# Patient Record
Sex: Male | Born: 1980 | Race: White | Hispanic: No | Marital: Single | Smoking: Never smoker
Health system: Southern US, Community
[De-identification: ages and names within clinical notes are randomized; demographics above are authoritative.]

---

## 2011-06-30 ENCOUNTER — Emergency Department: Payer: Self-pay | Admitting: Emergency Medicine

## 2012-10-04 IMAGING — CT CT HEAD WITHOUT CONTRAST
2 series · 16 of 30 positions shown, 20 images · non-contrast
Comparison: none

REASON FOR EXAM: worsening sever headache
COMMENTS:

[Series 2: without · axial · non-contrast · 0.45mm/px · z∈[+330,+454]mm · 13 of 31 slices shown, 17 images]
[im 3/31  brain]
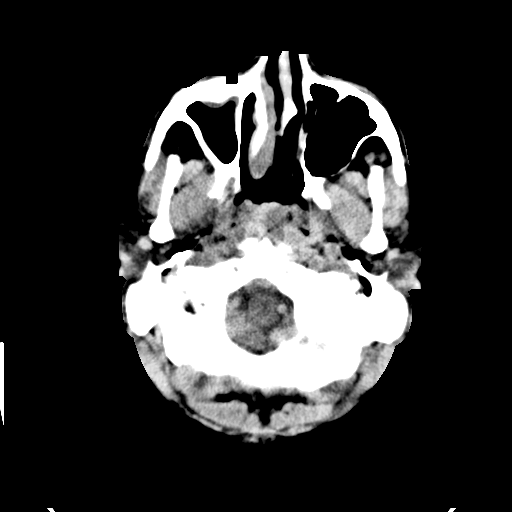
[im 3/31  bone]
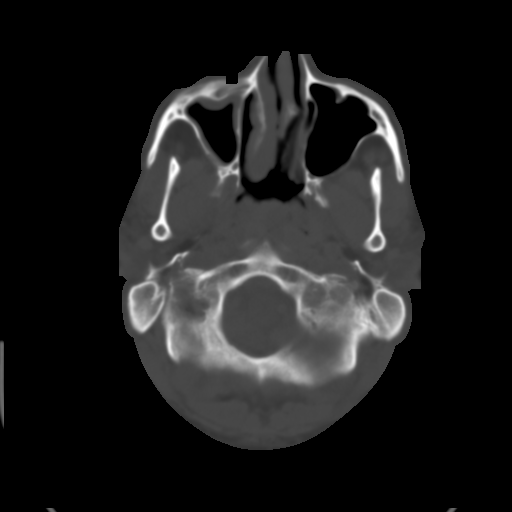
[im 5/31  brain]
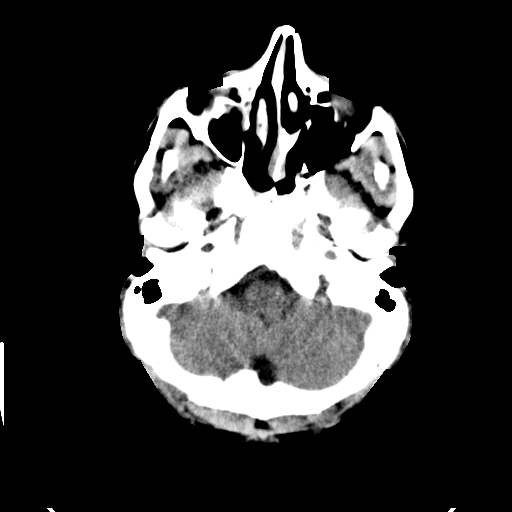
[im 7/31  brain]
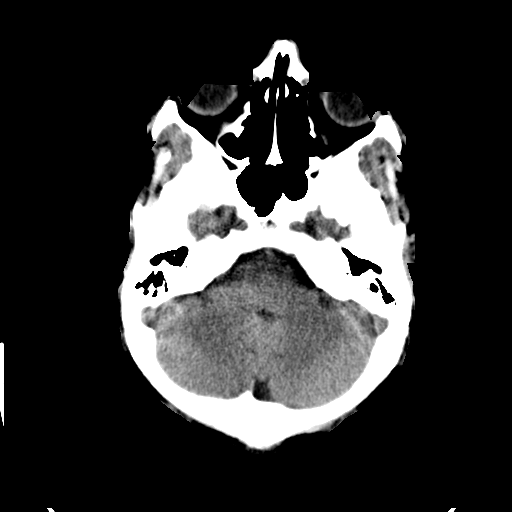
[im 9/31  brain]
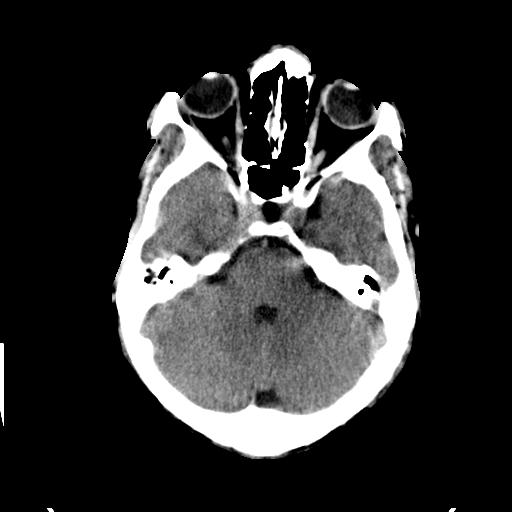
[im 11/31  brain]
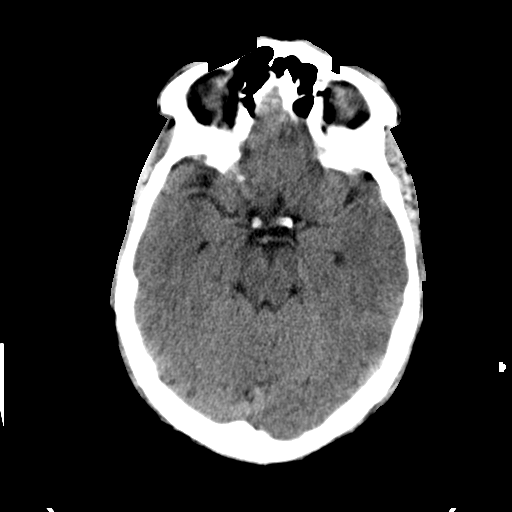
[im 11/31  bone]
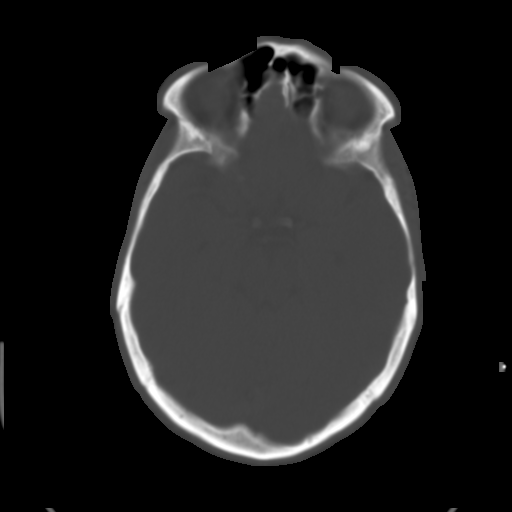
[im 13/31  brain]
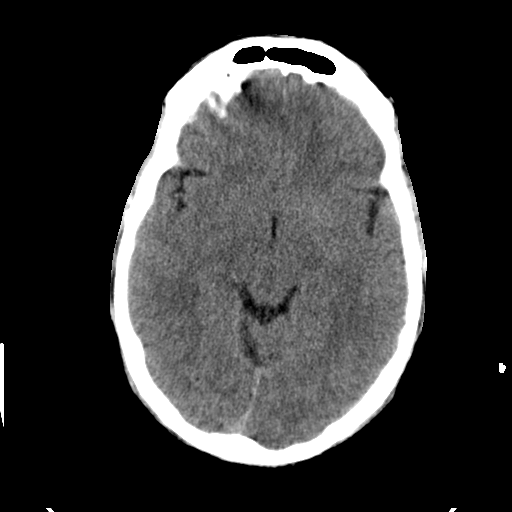
[im 16/31  brain]
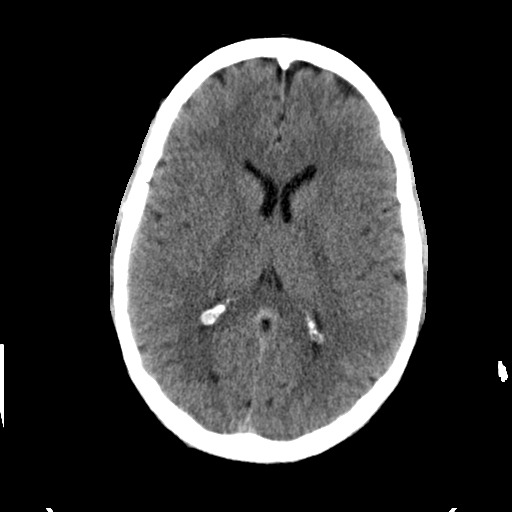
[im 18/31  brain]
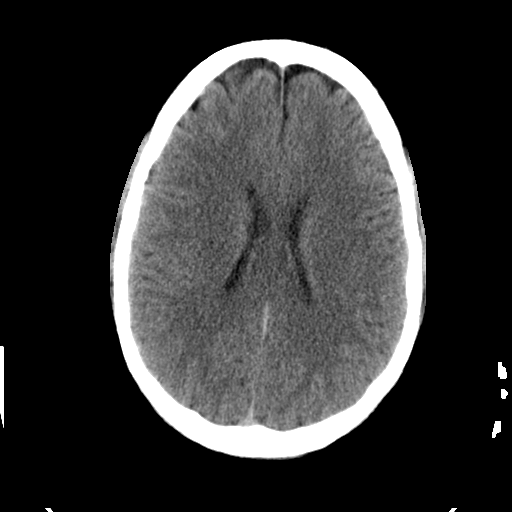
[im 20/31  brain]
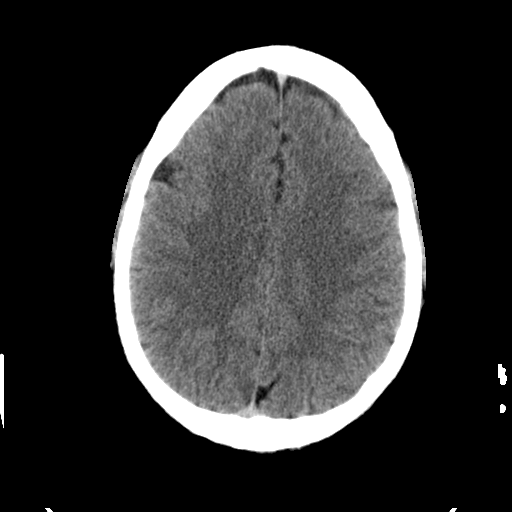
[im 20/31  bone]
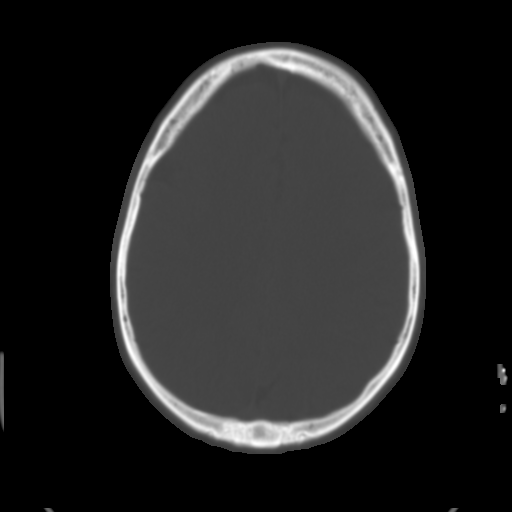
[im 22/31  brain]
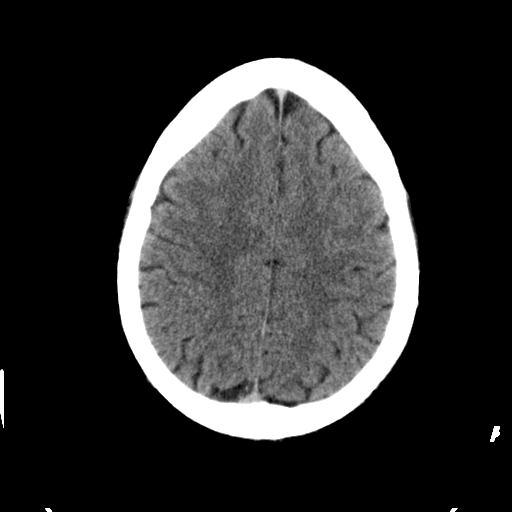
[im 24/31  brain]
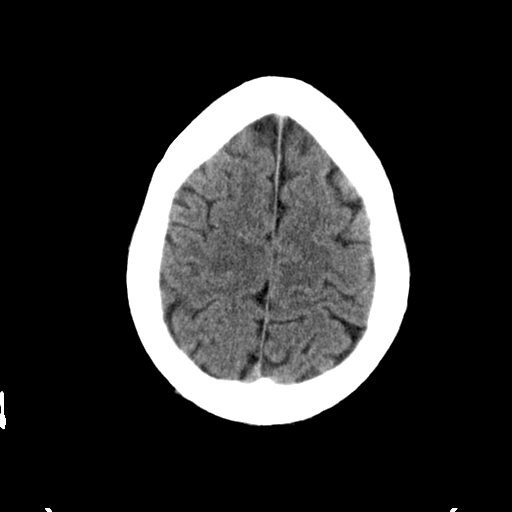
[im 26/31  brain]
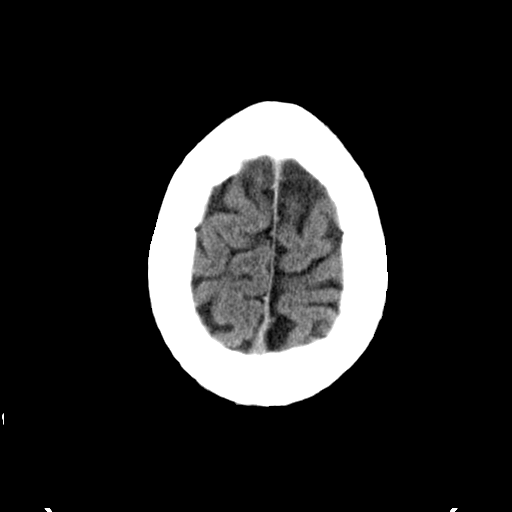
[im 28/31  brain]
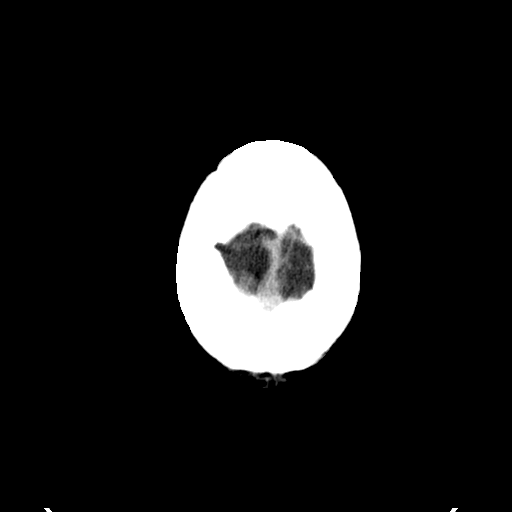
[im 28/31  bone]
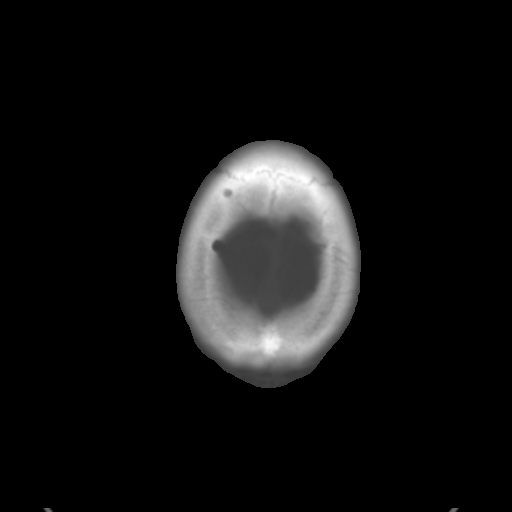

[Series 3: bone · axial · 0.45mm/px · z∈[+330,+370]mm · 3 of 31 slices shown]
[im 3/31  bone]
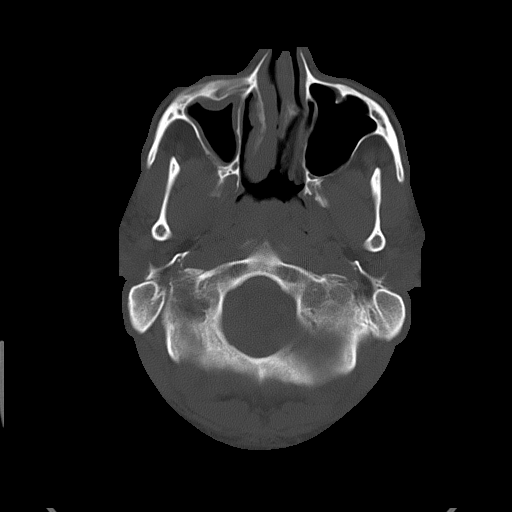
[im 7/31  bone]
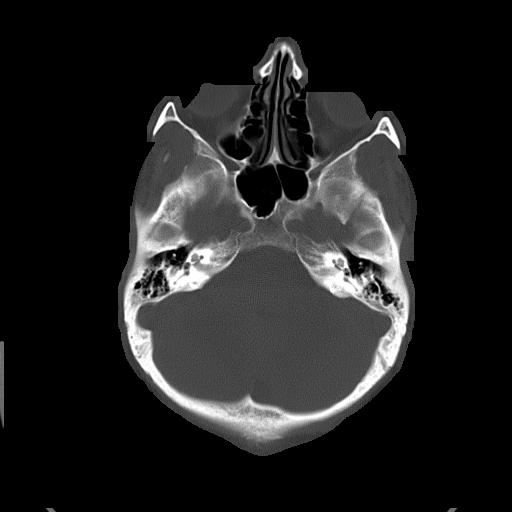
[im 11/31  bone]
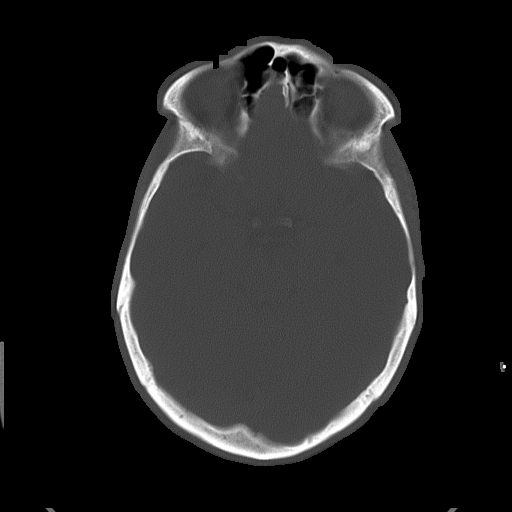

[16 of 30 positions shown; findings below may reference images not displayed]

PROCEDURE:     CT  - CT HEAD WITHOUT CONTRAST  - July 01, 2011  [DATE]

RESULT:     Axial noncontrast CT scanning was performed through the brain
with reconstructions at 5 mm intervals and slice thicknesses.

The ventricles are normal in size and position. There is no intracranial
hemorrhage nor intracranial mass effect. The cerebellum and brainstem are
normal in density. At bone window settings the observed portions of the
paranasal sinuses and mastoid air cells exhibit no air-fluid levels. There
is mucoperiosteal thickening within the right maxillary sinus. There is no
evidence of an acute skull fracture.
IMPRESSION: I see no acute abnormality of the brain. There is mild
inflammatory change of the right maxillary sinus.

A preliminary report was sent to the [HOSPITAL] the conclusion
of the study.

## 2020-02-23 ENCOUNTER — Emergency Department
Admission: EM | Admit: 2020-02-23 | Discharge: 2020-02-23 | Disposition: A | Discharge status: Home / Self Care | Payer: Self-pay | Attending: Student in an Organized Health Care Education/Training Program | Admitting: Student in an Organized Health Care Education/Training Program

## 2020-02-23 ENCOUNTER — Encounter: Payer: Self-pay | Admitting: Emergency Medicine

## 2020-02-23 ENCOUNTER — Emergency Department: Payer: Self-pay

## 2020-02-23 ENCOUNTER — Other Ambulatory Visit: Payer: Self-pay

## 2020-02-23 DIAGNOSIS — L03011 Cellulitis of right finger: Secondary | ICD-10-CM | POA: Insufficient documentation

## 2020-02-23 MED ORDER — NAPROXEN 500 MG PO TABS
500.0000 mg | ORAL_TABLET | Freq: Two times a day (BID) | ORAL | 0 refills | Status: AC
Start: 2020-02-23 — End: ?

## 2020-02-23 MED ORDER — TRAMADOL HCL 50 MG PO TABS: 50.0000 mg | ORAL_TABLET | Freq: Four times a day (QID) | ORAL | 0 refills | Status: AC | PRN

## 2020-02-23 MED ORDER — LIDOCAINE-EPINEPHRINE-TETRACAINE (LET) TOPICAL GEL
3.0000 mL | Freq: Once | TOPICAL | Status: AC
  Administered 2020-02-23: 3 mL via TOPICAL
  Filled 2020-02-23: qty 3

## 2020-02-23 MED ORDER — SULFAMETHOXAZOLE-TRIMETHOPRIM 800-160 MG PO TABS: 1.0000 | ORAL_TABLET | Freq: Two times a day (BID) | ORAL | 0 refills | Status: AC

## 2020-02-23 NOTE — ED Triage Notes (Signed)
Pt presents to ED via POV with c/o L thumb pain/swelling. Pt with noted swelling and bruising to L thumb, pt with bruising around the thumb nail at this time. Pt denies known injury to thumb at this time.

## 2020-02-23 NOTE — ED Notes (Signed)
See triage note  Presents with pain and swelling to left thumb  States he noted some pain to lateral aspect of nail bed yesterday  Woke up with swelling bruising under nail bed and redness

## 2020-02-23 NOTE — Discharge Instructions (Signed)
Follow discharge care instructions and take medication as directed.  Advised Epson salt soaks twice a day for 3 to 5 days.

## 2020-02-23 NOTE — ED Provider Notes (Signed)
[facility] Emergency Department Provider Note   ____________________________________________   First MD Initiated Contact with Patient 02/23/20 [phone]     (approximate)  I have reviewed the triage vital signs and the nursing notes.   HISTORY  Chief Complaint Hand Pain (L Thumb)    HPI OMARRI EICH is a 39 y.o. male patient presents with pain, edema, and subungual hematoma left thumb.  Patient complaint started 2 days ago.  Patient states cannot remember provoking incident for complaint.  Patient denies loss sensation loss of function.  Patient is right-hand dominant.  Patient rates pain as 8/10.  Patient described the pain is "achy".  No palliative measure for complaint.      History reviewed. No pertinent past medical history.  There are no problems to display for this patient.   History reviewed. No pertinent surgical history.  Prior to Admission medications   Medication Sig Start Date End Date Taking? Authorizing Provider  naproxen (NAPROSYN) 500 MG tablet Take 1 tablet (500 mg total) by mouth 2 (two) times daily with a meal. 02/23/20   Joni Reining, PA-C  sulfamethoxazole-trimethoprim (BACTRIM DS) 800-160 MG tablet Take 1 tablet by mouth 2 (two) times daily. 02/23/20   Joni Reining, PA-C  traMADol (ULTRAM) 50 MG tablet Take 1 tablet (50 mg total) by mouth every 6 (six) hours as needed for moderate pain. 02/23/20   Joni Reining, PA-C    Allergies Patient has no known allergies.  No family history on file.  Social History Social History   Tobacco Use  . Smoking status: Never Smoker  . Smokeless tobacco: Never Used  Substance Use Topics  . Alcohol use: Not Currently  . Drug use: Not Currently    Comment: Hx of drug abuse    Review of Systems Constitutional: No fever/chills Eyes: No visual changes. ENT: No sore throat. Cardiovascular: Denies chest pain. Respiratory: Denies shortness of breath. Gastrointestinal: No abdominal  pain.  No nausea, no vomiting.  No diarrhea.  No constipation. Genitourinary: Negative for dysuria. Musculoskeletal: Edema left distal thumb. Skin: Negative for rash. Neurological: Negative for headaches, focal weakness or numbness.   ____________________________________________   PHYSICAL EXAM:  VITAL SIGNS: ED Triage Vitals  Enc Vitals Group     BP 02/23/20 0722 (!) 142/86     Pulse Rate 02/23/20 0722 75     Resp 02/23/20 0722 20     Temp 02/23/20 0722 98.4 F (36.9 C)     Temp Source 02/23/20 0722 Oral     SpO2 02/23/20 0722 100 %     Weight 02/23/20 0720 (!) 205 lb (93 kg)     Height 02/23/20 0720 5\' 8"  (1.727 m)     Head Circumference --      Peak Flow --      Pain Score 02/23/20 0720 8     Pain Loc --      Pain Edu? --      Excl. in GC? --     Constitutional: Alert and oriented. Well appearing and in no acute distress. Cardiovascular: Normal rate, regular rhythm. Grossly normal heart sounds.  Good peripheral circulation. Respiratory: Normal respiratory effort.  No retractions. Lungs CTAB. Musculoskeletal: No obvious deformity to the left thumb.  Marked edema. Neurologic:  Normal speech and language. No gross focal neurologic deficits are appreciated. No gait instability. Skin:  Skin is warm, dry and intact.  2 blisters to dorsal aspect of the left thumb.  Small subungual hematoma.  Psychiatric: Mood and affect are normal. Speech and behavior are normal.  ____________________________________________   LABS (all labs ordered are listed, but only abnormal results are displayed)  Labs Reviewed - No data to display ____________________________________________  EKG   ____________________________________________  RADIOLOGY  ED MD interpretation:    Official radiology report(s): DG Finger Thumb Left  Result Date: 02/23/2020 CLINICAL DATA:  Pain and edema with subungual hematoma EXAM: LEFT THUMB 2+V COMPARISON:  None. FINDINGS: Frontal, oblique, and lateral  views were obtained. There is soft tissue swelling of the first digit with subungual soft tissue fullness. No radiopaque foreign body or soft tissue air. No fracture or dislocation. Joint spaces appear normal. No erosive change or bony destruction. IMPRESSION: No bony abnormality. No arthropathy. Subungual soft tissue swelling as well as somewhat milder swelling throughout the first digit. No soft tissue air or radiopaque foreign body evident. Electronically Signed   By: Bretta Bang III M.D.   On: 02/23/2020 08:00    ____________________________________________   PROCEDURES  Procedure(s) performed (including Critical Care):  Marland KitchenMarland KitchenIncision and Drainage  Date/Time: 02/23/2020 8:56 AM Performed by: Joni Reining, PA-C Authorized by: Joni Reining, PA-C   Consent:    Consent obtained:  Verbal   Consent given by:  Patient   Risks discussed:  Bleeding, incomplete drainage and pain Location:    Type:  Abscess   Location:  Upper extremity   Upper extremity location:  Finger   Finger location:  L thumb Pre-procedure details:    Skin preparation:  Betadine Anesthesia (see MAR for exact dosages):    Anesthesia method:  Topical application   Topical anesthetic:  LET Procedure type:    Complexity:  Simple Procedure details:    Incision types:  Stab incision   Incision depth:  Subcutaneous   Scalpel blade:  11   Wound management:  Probed and deloculated   Drainage:  Purulent   Drainage amount:  Moderate   Wound treatment:  Wound left open Post-procedure details:    Patient tolerance of procedure:  Tolerated well, no immediate complications     ____________________________________________   INITIAL IMPRESSION / ASSESSMENT AND PLAN / ED COURSE  As part of my medical decision making, I reviewed the following data within the electronic MEDICAL RECORD NUMBER     Patient presents with edema and erythema to left thumb.  Discussed negative x-ray findings with patient.  Patient  complaining physical exam consistent with paronychia.  See procedure note for incision and drainage.  Patient given discharge care instruction advised take medication as directed.  Patient advised to ED if condition worsens.    Patrick Bush was evaluated in Emergency Department on 02/23/2020 for the symptoms described in the history of present illness. He was evaluated in the context of the global COVID-19 pandemic, which necessitated consideration that the patient might be at risk for infection with the SARS-CoV-2 virus that causes COVID-19. Institutional protocols and algorithms that pertain to the evaluation of patients at risk for COVID-19 are in a state of rapid change based on information released by regulatory bodies including the CDC and federal and state organizations. These policies and algorithms were followed during the patient's care in the ED.       ____________________________________________   FINAL CLINICAL IMPRESSION(S) / ED DIAGNOSES  Final diagnoses:  Paronychia of finger of right hand     ED Discharge Orders         Ordered    sulfamethoxazole-trimethoprim (BACTRIM DS) 800-160 MG tablet  2 times daily     Discontinue  Reprint     02/23/20 0854    naproxen (NAPROSYN) 500 MG tablet  2 times daily with meals     Discontinue  Reprint     02/23/20 0854    traMADol (ULTRAM) 50 MG tablet  Every 6 hours PRN     Discontinue  Reprint     02/23/20 0854           Note:  This document was prepared using Dragon voice recognition software and may include unintentional dictation errors.    Joni Reining, PA-C 02/23/20 4854    Willy Eddy, MD 02/23/20 [phone]

## 2021-05-29 IMAGING — DX DG FINGER THUMB 2+V*L*
2 series · 2 of 2 positions shown · non-contrast
Comparison: None.

CLINICAL DATA: Pain and edema with subungual hematoma

EXAM:
LEFT THUMB 2+V

[finger obl]
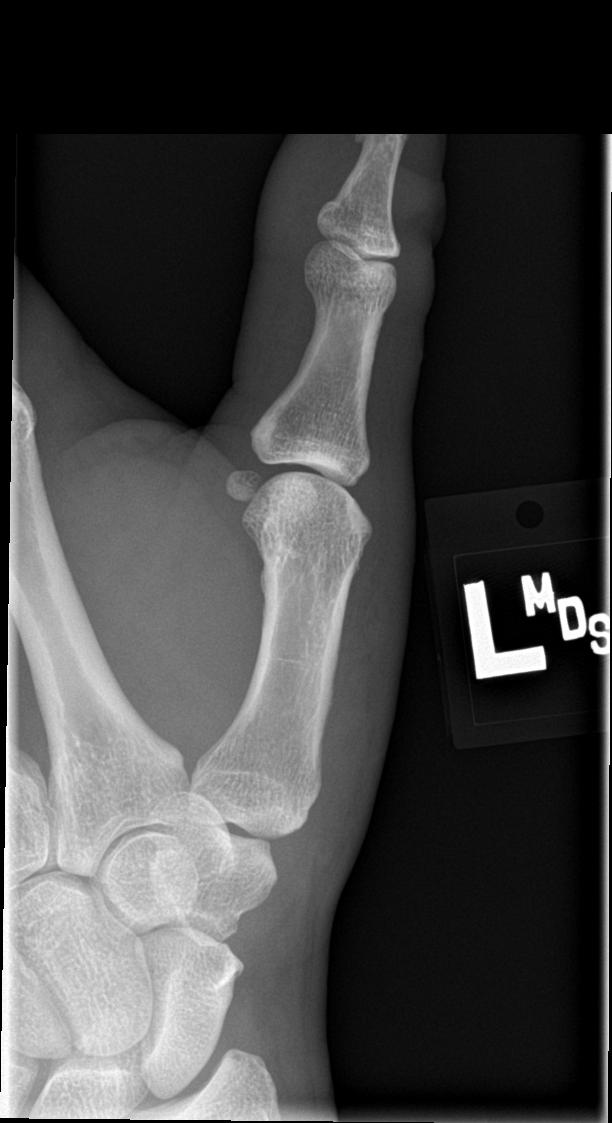

[finger lat]
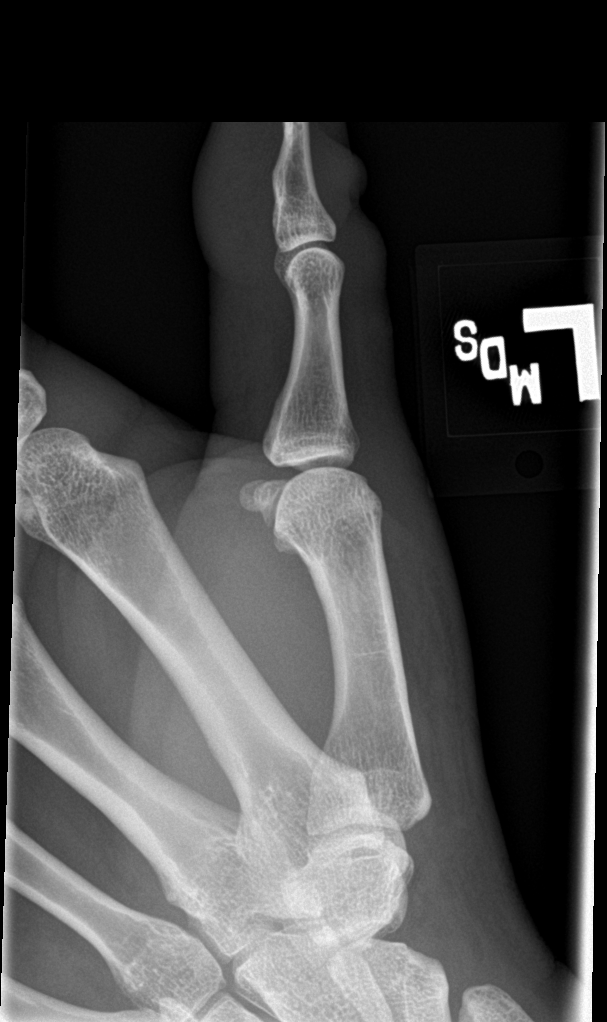

[2 of 2 positions shown; findings below may reference images not displayed]

FINDINGS: Frontal, oblique, and lateral views were obtained. There is soft
tissue swelling of the first digit with subungual soft tissue
fullness. No radiopaque foreign body or soft tissue air. No fracture
or dislocation. Joint spaces appear normal. No erosive change or
bony destruction.
IMPRESSION: No bony abnormality. No arthropathy. Subungual soft tissue swelling
as well as somewhat milder swelling throughout the first digit. No
soft tissue air or radiopaque foreign body evident.

## 2022-07-02 ENCOUNTER — Emergency Department
Admission: EM | Admit: 2022-07-02 | Discharge: 2022-07-02 | Disposition: A | Discharge status: Home / Self Care | Attending: Emergency Medicine | Admitting: Emergency Medicine

## 2022-07-02 ENCOUNTER — Other Ambulatory Visit: Payer: Self-pay

## 2022-07-02 DIAGNOSIS — T50901A Poisoning by unspecified drugs, medicaments and biological substances, accidental (unintentional), initial encounter: Secondary | ICD-10-CM | POA: Insufficient documentation

## 2022-07-02 DIAGNOSIS — R739 Hyperglycemia, unspecified: Secondary | ICD-10-CM | POA: Insufficient documentation

## 2022-07-02 DIAGNOSIS — T6591XA Toxic effect of unspecified substance, accidental (unintentional), initial encounter: Secondary | ICD-10-CM

## 2022-07-02 LAB — COMPREHENSIVE METABOLIC PANEL
ALT: 18 U/L (ref 0–44)
AST: 21 U/L (ref 15–41)
Albumin: 4.5 g/dL (ref 3.5–5.0)
Alkaline Phosphatase: 74 U/L (ref 38–126)
Anion gap: 6 (ref 5–15)
BUN: 13 mg/dL (ref 6–20)
CO2: 27 mmol/L (ref 22–32)
Calcium: 9.6 mg/dL (ref 8.9–10.3)
Chloride: 107 mmol/L (ref 98–111)
Creatinine, Ser: 1.07 mg/dL (ref 0.61–1.24)
GFR, Estimated: >60 mL/min (ref >60)
Glucose, Bld: 49 mg/dL — ABNORMAL LOW (ref 70–99)
Potassium: 3.9 mmol/L (ref 3.5–5.1)
Sodium: 140 mmol/L (ref 135–145)
Total Bilirubin: 0.6 mg/dL (ref 0.3–1.2)
Total Protein: 7.9 g/dL (ref 6.5–8.1)

## 2022-07-02 LAB — CBC
HCT: 40.4 % (ref 39.0–52.0)
Hemoglobin: 13.3 g/dL (ref 13.0–17.0)
MCH: 28.2 pg (ref 26.0–34.0)
MCHC: 32.9 g/dL (ref 30.0–36.0)
MCV: 85.8 fL (ref 80.0–100.0)
Platelets: 339 10*3/uL (ref 150–400)
RBC: 4.71 MIL/uL (ref 4.22–5.81)
RDW: 12.8 % (ref 11.5–15.5)
WBC: 7.6 10*3/uL (ref 4.0–10.5)
nRBC: 0 % (ref 0.0–0.2)

## 2022-07-02 LAB — CBG MONITORING, ED
Glucose-Capillary: 134 mg/dL — ABNORMAL HIGH (ref 70–99)
Glucose-Capillary: 133 mg/dL — ABNORMAL HIGH (ref 70–99)

## 2022-07-02 LAB — SALICYLATE LEVEL: Salicylate Lvl: <7 mg/dL — ABNORMAL LOW (ref 7.0–30.0)

## 2022-07-02 LAB — ETHANOL: Alcohol, Ethyl (B): <10 mg/dL (ref <10)

## 2022-07-02 LAB — ACETAMINOPHEN LEVEL: Acetaminophen (Tylenol), Serum: <10 ug/mL — ABNORMAL LOW (ref 10–30)

## 2022-07-02 MED ORDER — SODIUM CHLORIDE 0.9 % IV BOLUS
1000.0000 mL | Freq: Once | INTRAVENOUS | Status: AC
  Administered 2022-07-02: 1000 mL via INTRAVENOUS

## 2022-07-02 NOTE — Discharge Instructions (Signed)
Return to the ER for worsening symptoms, persistent vomiting, lethargy or other concerns. °

## 2022-07-02 NOTE — ED Notes (Signed)
Patient CMP glucose was 49.  Patient alert and oriented patient given OJ and peanut butter crackers.

## 2022-07-02 NOTE — ED Provider Notes (Signed)
[facility] Provider Note    Event Date/Time   First MD Initiated Contact with Patient 07/02/22 0124     (approximate)   History   Overdose   HPI  Patrick Bush is a 41 y.o. male [facility] with a chief complaint of overdose.  Jail officer reports that patient ingested a pill which was witnessed by staff.  Patient reports it was Suboxone.  Staff suspected fentanyl so preemptively gave Narcan.  Patient arrives with complaints of "withdrawals", shivering and complains of feeling cold.  Denies chest pain, shortness of breath, abdominal pain, nausea, vomiting or dizziness.     Past Medical History  History reviewed. No pertinent past medical history.   Active Problem List  There are no problems to display for this patient.    Past Surgical History  History reviewed. No pertinent surgical history.   Home Medications   Prior to Admission medications   Medication Sig Start Date End Date Taking? Authorizing Provider  naproxen (NAPROSYN) 500 MG tablet Take 1 tablet (500 mg total) by mouth 2 (two) times daily with a meal. 02/23/20   Joni Reining, PA-C  sulfamethoxazole-trimethoprim (BACTRIM DS) 800-160 MG tablet Take 1 tablet by mouth 2 (two) times daily. 02/23/20   Joni Reining, PA-C  traMADol (ULTRAM) 50 MG tablet Take 1 tablet (50 mg total) by mouth every 6 (six) hours as needed for moderate pain. 02/23/20   Joni Reining, PA-C     Allergies  Patient has no known allergies.   Family History  History reviewed. No pertinent family history.   Physical Exam  Triage Vital Signs: ED Triage Vitals  Enc Vitals Group     BP      Pulse      Resp      Temp      Temp src      SpO2      Weight      Height      Head Circumference      Peak Flow      Pain Score      Pain Loc      Pain Edu?      Excl. in GC?     Updated Vital Signs: BP 130/78 (BP Location: Right Arm)   Pulse (!) 102   Temp 97.9 F (36.6 C)  (Oral)   Resp 18   Ht 5\' 8"  (1.727 m)   Wt 93 kg   SpO2 100%   BMI 31.17 kg/m    General: Awake, no distress.  CV:  RRR.  Good peripheral perfusion.  Resp:  Increased effort.  CTA B. Abd:  Nontender.  No distention.  Other:  Appropriate affect.   ED Results / Procedures / Treatments  Labs (all labs ordered are listed, but only abnormal results are displayed) Labs Reviewed  COMPREHENSIVE METABOLIC PANEL - Abnormal; Notable for the following components:      Result Value   Glucose, Bld 49 (*)    All other components within normal limits  ACETAMINOPHEN LEVEL - Abnormal; Notable for the following components:   Acetaminophen (Tylenol), Serum <10 (*)    All other components within normal limits  SALICYLATE LEVEL - Abnormal; Notable for the following components:   Salicylate Lvl <7.0 (*)    All other components within normal limits  CBG MONITORING, ED - Abnormal; Notable for the following components:   Glucose-Capillary 134 (*)    All other  components within normal limits  CBG MONITORING, ED - Abnormal; Notable for the following components:   Glucose-Capillary 133 (*)    All other components within normal limits  CBC  ETHANOL  URINE DRUG SCREEN, QUALITATIVE ([facility] ONLY)     EKG  ED ECG REPORT I, Tyneka Scafidi J, the attending physician, personally viewed and interpreted this ECG.   Date: 07/02/2022  EKG Time: 0130  Rate: 92  Rhythm: normal sinus rhythm  Axis: Normal  Intervals:none  ST&T Change: Nonspecific    RADIOLOGY None   Official radiology report(s): No results found.   PROCEDURES:  Critical Care performed: No  Procedures   MEDICATIONS ORDERED IN ED: Medications  sodium chloride 0.9 % bolus 1,000 mL (0 mLs Intravenous Stopped 07/02/22 0250)     IMPRESSION / MDM / ASSESSMENT AND PLAN / ED COURSE  I reviewed the triage vital signs and the nursing notes.                             41 year old male presenting with ingestion of unknown substance.  Differential diagnosis includes, but is not limited to, alcohol, illicit or prescription medications, or other toxic ingestion; intracranial pathology such as stroke or intracerebral hemorrhage; fever or infectious causes including sepsis; hypoxemia and/or hypercarbia; uremia; trauma; endocrine related disorders such as diabetes, hypoglycemia, and thyroid-related diseases; hypertensive encephalopathy; etc. I personally reviewed patient's records and note that he had a orthopedic office visit in January 2022 for postoperative wound infection.  Patient's presentation is most consistent with acute presentation with potential threat to life or bodily function.  The patient is on the cardiac monitor to evaluate for evidence of arrhythmia and/or significant heart rate changes.  Patient denies taking pill to harm or kill himself.  Denies HI/AH/VH.  Will obtain toxicological lab work and urine.  Initiate IV fluid resuscitation.  Will monitor in the emergency department.  Clinical Course as of 07/02/22 0504  Wed Jul 02, 2022  0251 Glucose 49 on serum chemistry; patient willing to drink some juice. [JS]  0357 Repeat blood sugar is 134.  Laboratory results demonstrate normal WBC 7.6, normal electrolytes with the exception of hypoglycemia glucose 49; acetaminophen/salicylate/EtOH unremarkable.  We will continue to monitor and care for patient. [JS]  0503 Repeat FSGS remains stable at 133.  Patient awake, alert, hemodynamically stable, voices no complaints.  Patient is safe for discharge back to jail.  Strict return precautions given.  Patient verbalizes understanding agrees with plan of care. [JS]    Clinical Course User Index [JS] Irean Hong, MD     FINAL CLINICAL IMPRESSION(S) / ED DIAGNOSES   Final diagnoses:  Ingestion of substance, accidental or unintentional, initial encounter     Rx / DC Orders   ED Discharge Orders     None        Note:  This document was prepared using Dragon voice  recognition software and may include unintentional dictation errors.   Irean Hong, MD 07/02/22 [phone]

## 2022-07-02 NOTE — ED Triage Notes (Signed)
Patient arrived to ED by [facility] with correction officer from [facility] detention [facility]. Per Correction officer patient was seen taking an unknown substance at 0055. Officer gave patient dose of narcan at 0100.

## 2022-07-02 NOTE — ED Notes (Signed)
Patient unable to give urine sample at this time

## 2022-07-02 NOTE — ED Notes (Signed)
Discharge paperwork reviewed with patient and correction officer. Patient and correction officer verbalized understanding. Patient discharge into the custody of correctional officer to return to [facility] jail.

## 2022-07-02 NOTE — ED Notes (Signed)
Patient states he is feeling a little better.
